# Patient Record
Sex: Male | Born: 1970 | Hispanic: Yes | Marital: Single | Smoking: Never smoker
Health system: Southern US, Community
[De-identification: ages and names within clinical notes are randomized; demographics above are authoritative.]

## PROBLEM LIST (undated history)

## (undated) DIAGNOSIS — M549 Dorsalgia, unspecified: Secondary | ICD-10-CM

---

## 2019-04-22 ENCOUNTER — Encounter (HOSPITAL_COMMUNITY): Payer: Self-pay | Admitting: Emergency Medicine

## 2019-04-22 ENCOUNTER — Emergency Department (HOSPITAL_COMMUNITY)
Admission: EM | Admit: 2019-04-22 | Discharge: 2019-04-23 | Disposition: A | Discharge status: Home / Self Care | Payer: No Typology Code available for payment source | Attending: Emergency Medicine | Admitting: Emergency Medicine

## 2019-04-22 ENCOUNTER — Other Ambulatory Visit: Payer: Self-pay

## 2019-04-22 DIAGNOSIS — R202 Paresthesia of skin: Secondary | ICD-10-CM | POA: Insufficient documentation

## 2019-04-22 DIAGNOSIS — Z5321 Procedure and treatment not carried out due to patient leaving prior to being seen by health care provider: Secondary | ICD-10-CM | POA: Insufficient documentation

## 2019-04-22 NOTE — ED Triage Notes (Signed)
Patient reports left arm numbness and tingling onset 3 days ago , denies injury  , pt. added itchy insect bite at left upper back .

## 2019-04-23 NOTE — ED Notes (Signed)
Pt left lobby and went home.

## 2019-12-21 ENCOUNTER — Ambulatory Visit: Payer: No Typology Code available for payment source | Attending: Anesthesiology

## 2020-03-19 ENCOUNTER — Encounter (HOSPITAL_COMMUNITY): Payer: Self-pay | Admitting: Emergency Medicine

## 2020-03-19 ENCOUNTER — Ambulatory Visit (INDEPENDENT_AMBULATORY_CARE_PROVIDER_SITE_OTHER): Payer: Self-pay

## 2020-03-19 ENCOUNTER — Ambulatory Visit (HOSPITAL_COMMUNITY)
Admission: EM | Admit: 2020-03-19 | Discharge: 2020-03-19 | Disposition: A | Discharge status: Home / Self Care | Payer: Self-pay | Attending: Family Medicine | Admitting: Family Medicine

## 2020-03-19 ENCOUNTER — Other Ambulatory Visit: Payer: Self-pay

## 2020-03-19 DIAGNOSIS — S161XXA Strain of muscle, fascia and tendon at neck level, initial encounter: Secondary | ICD-10-CM

## 2020-03-19 DIAGNOSIS — M542 Cervicalgia: Secondary | ICD-10-CM

## 2020-03-19 HISTORY — DX: Dorsalgia, unspecified: M54.9

## 2020-03-19 MED ORDER — PREDNISONE 10 MG (21) PO TBPK: ORAL_TABLET | ORAL | 0 refills | Status: DC

## 2020-03-19 MED ORDER — CYCLOBENZAPRINE HCL 10 MG PO TABS: 10.0000 mg | ORAL_TABLET | Freq: Two times a day (BID) | ORAL | 0 refills | Status: AC | PRN
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
## 2024-05-08 ENCOUNTER — Encounter (HOSPITAL_COMMUNITY): Payer: Self-pay | Admitting: *Deleted

## 2024-05-08 ENCOUNTER — Other Ambulatory Visit: Payer: Self-pay

## 2024-05-08 ENCOUNTER — Emergency Department (HOSPITAL_COMMUNITY)
Admission: EM | Admit: 2024-05-08 | Discharge: 2024-05-08 | Disposition: A | Discharge status: Home / Self Care | Attending: Emergency Medicine | Admitting: Emergency Medicine

## 2024-05-08 DIAGNOSIS — M25531 Pain in right wrist: Secondary | ICD-10-CM | POA: Insufficient documentation

## 2024-05-08 LAB — CBC
HCT: 41.7 % (ref 39.0–52.0)
Hemoglobin: 13.8 g/dL (ref 13.0–17.0)
MCH: 31 pg (ref 26.0–34.0)
MCHC: 33.1 g/dL (ref 30.0–36.0)
MCV: 93.7 fL (ref 80.0–100.0)
Platelets: 280 K/uL (ref 150–400)
RBC: 4.45 MIL/uL (ref 4.22–5.81)
RDW: 13.5 % (ref 11.5–15.5)
WBC: 8.8 K/uL (ref 4.0–10.5)
nRBC: 0 % (ref 0.0–0.2)

## 2024-05-08 LAB — URIC ACID: Uric Acid, Serum: 4.3 mg/dL (ref 3.7–8.6)

## 2024-05-08 MED ORDER — OXYCODONE-ACETAMINOPHEN 5-325 MG PO TABS
ORAL_TABLET | ORAL | Status: AC
  Filled 2024-05-08: qty 1

## 2024-05-08 MED ORDER — METHYLPREDNISOLONE SODIUM SUCC 125 MG IJ SOLR
125.0000 mg | Freq: Once | INTRAMUSCULAR | Status: AC
  Administered 2024-05-08: 125 mg via INTRAMUSCULAR
  Filled 2024-05-08: qty 2

## 2024-05-08 MED ORDER — OXYCODONE-ACETAMINOPHEN 5-325 MG PO TABS
1.0000 | ORAL_TABLET | Freq: Once | ORAL | Status: AC
  Administered 2024-05-08: 1 via ORAL

## 2024-05-08 MED ORDER — OXYCODONE-ACETAMINOPHEN 5-325 MG PO TABS
1.0000 | ORAL_TABLET | Freq: Once | ORAL | Status: AC
  Administered 2024-05-08: 1 via ORAL
  Filled 2024-05-08: qty 1

## 2024-05-08 MED ORDER — PREDNISONE 20 MG PO TABS: ORAL_TABLET | ORAL | 0 refills | Status: AC

## 2024-05-08 NOTE — ED Triage Notes (Signed)
 Rt wrist and rt hand swollen  [facility]

## 2024-05-08 NOTE — ED Triage Notes (Signed)
 The pt is c/o rt wrist pain for approx 3-4 days [facility] and was given steroidss now that the steroids are gone he is c/o severe pain
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
